# Patient Record
Sex: Male | Born: 2000 | Race: White | Hispanic: No | Marital: Single | Smoking: Never smoker
Health system: Southern US, Community
[De-identification: ages and names within clinical notes are randomized; demographics above are authoritative.]

---
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
## 2022-03-13 ENCOUNTER — Ambulatory Visit
Admission: RE | Admit: 2022-03-13 | Discharge: 2022-03-13 | Disposition: A | Discharge status: Home / Self Care | Payer: 59 | Source: Ambulatory Visit | Attending: Pediatrics | Admitting: Pediatrics

## 2022-03-13 ENCOUNTER — Other Ambulatory Visit: Payer: Self-pay

## 2022-03-13 ENCOUNTER — Ambulatory Visit
Admission: RE | Admit: 2022-03-13 | Discharge: 2022-03-13 | Disposition: A | Discharge status: Home / Self Care | Payer: 59 | Attending: Pediatrics | Admitting: Pediatrics

## 2022-03-13 DIAGNOSIS — M546 Pain in thoracic spine: Secondary | ICD-10-CM

## 2022-03-13 DIAGNOSIS — M542 Cervicalgia: Secondary | ICD-10-CM | POA: Diagnosis present

## 2022-06-11 ENCOUNTER — Ambulatory Visit (INDEPENDENT_AMBULATORY_CARE_PROVIDER_SITE_OTHER): Payer: 59

## 2022-06-11 ENCOUNTER — Ambulatory Visit
Admission: EM | Admit: 2022-06-11 | Discharge: 2022-06-11 | Disposition: A | Discharge status: Home / Self Care | Payer: 59 | Attending: Physician Assistant | Admitting: Physician Assistant

## 2022-06-11 DIAGNOSIS — M25571 Pain in right ankle and joints of right foot: Secondary | ICD-10-CM

## 2022-06-11 DIAGNOSIS — S93401A Sprain of unspecified ligament of right ankle, initial encounter: Secondary | ICD-10-CM | POA: Diagnosis not present

## 2022-06-11 NOTE — ED Triage Notes (Signed)
Pt presents with R ankle pain and swelling X 3 days.   Pt states he fell down the stairs.

## 2022-06-11 NOTE — Discharge Instructions (Addendum)
SPRAIN: Stressed avoiding painful activities . Reviewed RICE guidelines. Use medications as directed, including NSAIDs. If no NSAIDs have been prescribed for you today, you may take Aleve or Motrin over the counter. May use Tylenol in between doses of NSAIDs.  If no improvement in the next 1-2 weeks, f/u with PCP or return to our office for reexamination, and please feel free to call or return at any time for any questions or concerns you may have and we will be happy to help you!

## 2022-06-11 NOTE — ED Provider Notes (Signed)
[facility]    CSN: [id number] Arrival date & time: 06/11/22  1215      History   Chief Complaint Chief Complaint  Patient presents with   Ankle Pain   Fall    HPI Jeremiah Ramirez. is a 22 y.o. male presenting for right ankle pain and swelling x 3 days. Patient says he accidentally fell down a couple of stairs while visiting his girlfriend at her dorm. He is able to walk and bear weight with some discomfort. Reports increased discomfort with pushing down on pedals while driving and with inversion of foot. Denies numbness/tingling/weakness. Has been taking Tylenol and using ice. Swelling has improved. Patient would like to rule out fracture. No other injuries/concerns.  HPI  History reviewed. No pertinent past medical history.  There are no problems to display for this patient.   History reviewed. No pertinent surgical history.     Home Medications    Prior to Admission medications   Not on File    Family History History reviewed. No pertinent family history.  Social History Social History   Tobacco Use   Smoking status: Never   Smokeless tobacco: Never  Substance Use Topics   Alcohol use: No   Drug use: No     Allergies   Patient has no known allergies.   Review of Systems Review of Systems  Musculoskeletal:  Positive for arthralgias and joint swelling. Negative for gait problem.  Skin:  Negative for color change and wound.  Neurological:  Negative for weakness and numbness.     Physical Exam Triage Vital Signs ED Triage Vitals  Enc Vitals Group     BP      Pulse      Resp      Temp      Temp src      SpO2      Weight      Height      Head Circumference      Peak Flow      Pain Score      Pain Loc      Pain Edu?      Excl. in [place]?    No data found.  Updated Vital Signs BP 137/78 (BP Location: Right Arm)   Pulse 67   Temp 98.3 F (36.8 C) (Oral)   Resp 18   SpO2 97%     Physical Exam Vitals and nursing note  reviewed.  Constitutional:      General: He is not in acute distress.    Appearance: Normal appearance. He is well-developed. He is not ill-appearing.  HENT:     Head: Normocephalic and atraumatic.  Eyes:     General: No scleral icterus.    Conjunctiva/sclera: Conjunctivae normal.  Cardiovascular:     Rate and Rhythm: Normal rate and regular rhythm.  Pulmonary:     Effort: Pulmonary effort is normal. No respiratory distress.  Musculoskeletal:     Cervical back: Neck supple.     Right ankle: Swelling (moderate lateral ankle swelling) present. No ecchymosis. Tenderness present over the lateral malleolus, ATF ligament and base of 5th metatarsal. Decreased range of motion. Normal pulse.  Skin:    General: Skin is warm and dry.     Capillary Refill: Capillary refill takes less than 2 seconds.  Neurological:     General: No focal deficit present.     Mental Status: He is alert. Mental status is at baseline.     Motor: No weakness.  Gait: Gait normal.  Psychiatric:        Mood and Affect: Mood normal.        Behavior: Behavior normal.      UC Treatments / Results  Labs (all labs ordered are listed, but only abnormal results are displayed) Labs Reviewed - No data to display  EKG   Radiology DG Ankle Complete Right  Result Date: 06/11/2022 CLINICAL DATA:  pain and swelling of ankle after fall down stairs 2 days ago EXAM: RIGHT FOOT COMPLETE - 3 VIEW; RIGHT ANKLE - COMPLETE 3 VIEW COMPARISON:  None Available. FINDINGS: There is no evidence of fracture, dislocation, or subluxation. There is no evidence of arthropathy or other focal bone abnormality. Soft tissue swelling noted at the lateral malleolus. IMPRESSION: Soft tissue swelling. No acute osseous abnormality identified. Electronically Signed   By: Sammie Bench M.D.   On: 06/11/2022 13:27   DG Foot Complete Right  Result Date: 06/11/2022 CLINICAL DATA:  pain and swelling of ankle after fall down stairs 2 days ago EXAM:  RIGHT FOOT COMPLETE - 3 VIEW; RIGHT ANKLE - COMPLETE 3 VIEW COMPARISON:  None Available. FINDINGS: There is no evidence of fracture, dislocation, or subluxation. There is no evidence of arthropathy or other focal bone abnormality. Soft tissue swelling noted at the lateral malleolus. IMPRESSION: Soft tissue swelling. No acute osseous abnormality identified. Electronically Signed   By: Sammie Bench M.D.   On: 06/11/2022 13:27    Procedures Procedures (including critical care time)  Medications Ordered in UC Medications - No data to display  Initial Impression / Assessment and Plan / UC Course  I have reviewed the triage vital signs and the nursing notes.  Pertinent labs & imaging results that were available during my care of the patient were reviewed by me and considered in my medical decision making (see chart for details).   22 year old presents for right foot/ankle pain and swelling following a fall a couple days ago.  X-ray of foot and ankle obtained.  No acute fractures.  Ankle sprain.  Patient given lace up ankle brace.  Reviewed RICE guidelines, ibuprofen and Tylenol for pain.  Reviewed return precautions.   Final Clinical Impressions(s) / UC Diagnoses   Final diagnoses:  Sprain of right ankle, unspecified ligament, initial encounter  Acute right ankle pain     Discharge Instructions      SPRAIN: Stressed avoiding painful activities . Reviewed RICE guidelines. Use medications as directed, including NSAIDs. If no NSAIDs have been prescribed for you today, you may take Aleve or Motrin over the counter. May use Tylenol in between doses of NSAIDs.  If no improvement in the next 1-2 weeks, f/u with PCP or return to our office for reexamination, and please feel free to call or return at any time for any questions or concerns you may have and we will be happy to help you!         ED Prescriptions   None    PDMP not reviewed this encounter.   Danton Clap, PA-C 06/11/22  [phone]
